# Patient Record
Sex: Male | Born: 1990 | Race: Asian | Hispanic: No | Marital: Married | Smoking: Never smoker
Health system: Southern US, Community
[De-identification: ages and names within clinical notes are randomized; demographics above are authoritative.]

---
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
## 2014-09-15 ENCOUNTER — Ambulatory Visit: Payer: 59

## 2014-09-21 ENCOUNTER — Ambulatory Visit (INDEPENDENT_AMBULATORY_CARE_PROVIDER_SITE_OTHER): Payer: 59 | Admitting: Family Medicine

## 2014-09-21 VITALS — BP 120/84 | HR 81 | Temp 97.4°F | Resp 16 | Ht 65.0 in | Wt 182.6 lb

## 2014-09-21 DIAGNOSIS — Z0000 Encounter for general adult medical examination without abnormal findings: Secondary | ICD-10-CM

## 2014-09-21 DIAGNOSIS — Z119 Encounter for screening for infectious and parasitic diseases, unspecified: Secondary | ICD-10-CM | POA: Diagnosis not present

## 2014-09-21 DIAGNOSIS — Z23 Encounter for immunization: Secondary | ICD-10-CM | POA: Diagnosis not present

## 2014-09-21 LAB — HEPATITIS B SURFACE ANTIBODY, QUANTITATIVE: Hepatitis B-Post: 1000 m[IU]/mL

## 2014-09-21 LAB — HEPATITIS C ANTIBODY: HCV Ab: NEGATIVE

## 2014-09-21 LAB — HIV ANTIBODY (ROUTINE TESTING W REFLEX): HIV 1&2 Ab, 4th Generation: NONREACTIVE

## 2014-09-21 NOTE — Progress Notes (Signed)
[facility] [address], [place] [id number] [phone]  Date:  09/21/2014   Name:  Austin Bailey   DOB:  08/03/1990   MRN:  [id number]  PCP:  No primary care provider on file.    Chief Complaint: Annual Exam   History of Present Illness:  Austin Bailey is a 24 y.o. 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  He is generally in good health.  Were able to get his [place] shot record- he is all UTD on required vaccines. Will need a hep B titer, hep C titer, HIV , TB screening and physical form completed  He has no major pmhx, no medications.    There are no active problems to display for this patient.   History reviewed. No pertinent past medical history.  History reviewed. No pertinent past surgical history.  History  Substance Use Topics  . Smoking status: Never Smoker   . Smokeless tobacco: Not on file  . Alcohol Use: No    Family History  Problem Relation Age of Onset  . Hypertension Father     No Known Allergies  Medication list has been reviewed and updated.  No current outpatient prescriptions on file prior to visit.   No current facility-administered medications on file prior to visit.    Review of Systems:  As per HPI- otherwise negative.   Physical Examination: Filed Vitals:   09/21/14 0833  BP: 120/84  Pulse: 81  Temp: 97.4 F (36.3 C)  Resp: 16   Filed Vitals:   09/21/14 0833  Height: 5\' 5"  (1.651 m)  Weight: 182 lb 9.6 oz (82.827 kg)   Body mass index is 30.39 kg/(m^2). Ideal Body Weight: Weight in (lb) to have BMI = 25: 149.9  GEN: WDWN, NAD, Non-toxic, A & O x 3, overweight, looks well HEENT: Atraumatic, Normocephalic. Neck supple. No masses, No LAD.  Bilateral TM wnl, oropharynx normal.  PEERL,EOMI.   Ears and Nose: No external deformity. CV: RRR, No M/G/R. No JVD. No thrill. No extra heart sounds. PULM:  CTA B, no wheezes, crackles, rhonchi. No retractions. No resp. distress. No accessory muscle use. ABD: S, NT, ND EXTR: No c/c/e NEURO Normal gait. Normal strength and DTR all extremities  PSYCH: Normally interactive. Conversant. Not depressed or anxious appearing.  Calm demeanor.    Assessment and Plan: Physical exam  Screening examination for infectious disease - Plan: Hepatitis B surface antibody, HIV antibody, Hepatitis C antibody, Quantiferon tb gold assay  Immunization due - Plan: Flu Vaccine QUAD 36+ mos IM  Completed forms for school, did labs as above, await results.  Immunizations are UTD except for his flu shot- done today Will be in touch with the rest of his labs  Signed Abbe AmsterdamJessica Rachele Lamaster, MD

## 2014-09-21 NOTE — Patient Instructions (Signed)
Best of luck to you in school. I would recommend that you set up your mychart account so you can print and view labs at home

## 2014-09-22 LAB — QUANTIFERON TB GOLD ASSAY (BLOOD)
Interferon Gamma Release Assay: NEGATIVE
Mitogen value: >10 IU/mL
QUANTIFERON NIL VALUE: 0.06 [IU]/mL
QUANTIFERON TB AG MINUS NIL: 0 [IU]/mL
TB AG VALUE: 0.05 [IU]/mL

## 2014-09-23 ENCOUNTER — Encounter: Payer: Self-pay | Admitting: Family Medicine

## 2016-12-09 ENCOUNTER — Ambulatory Visit (INDEPENDENT_AMBULATORY_CARE_PROVIDER_SITE_OTHER): Payer: Managed Care, Other (non HMO)

## 2016-12-09 ENCOUNTER — Ambulatory Visit (INDEPENDENT_AMBULATORY_CARE_PROVIDER_SITE_OTHER): Payer: Managed Care, Other (non HMO) | Admitting: Urgent Care

## 2016-12-09 ENCOUNTER — Encounter: Payer: Self-pay | Admitting: Urgent Care

## 2016-12-09 VITALS — BP 110/84 | HR 61 | Temp 98.6°F | Resp 18 | Ht 65.0 in | Wt 187.0 lb

## 2016-12-09 DIAGNOSIS — M545 Low back pain, unspecified: Secondary | ICD-10-CM

## 2016-12-09 DIAGNOSIS — R030 Elevated blood-pressure reading, without diagnosis of hypertension: Secondary | ICD-10-CM

## 2016-12-09 LAB — POCT URINALYSIS DIP (MANUAL ENTRY)
Bilirubin, UA: NEGATIVE
GLUCOSE UA: NEGATIVE mg/dL
Ketones, POC UA: NEGATIVE mg/dL
Leukocytes, UA: NEGATIVE
Nitrite, UA: NEGATIVE
Spec Grav, UA: >=1.03 — AB (ref 1.010–1.025)
Urobilinogen, UA: 0.2 E.U./dL
pH, UA: 5.5 (ref 5.0–8.0)

## 2016-12-09 MED ORDER — NAPROXEN SODIUM 550 MG PO TABS: 550.0000 mg | ORAL_TABLET | Freq: Two times a day (BID) | ORAL | 1 refills | Status: DC

## 2016-12-09 MED ORDER — CYCLOBENZAPRINE HCL 5 MG PO TABS: 5.0000 mg | ORAL_TABLET | Freq: Three times a day (TID) | ORAL | 1 refills | Status: DC | PRN

## 2016-12-09 NOTE — Patient Instructions (Addendum)
- Make sure you perform back care stretches 3 times per week. Do 3 sets for each stretch at 10 seconds per side. Make sure you hydrate very well with at least 2 liters of water (that's 1 gallon) daily. - If your blood pressure remains at or higher than 140/90 for either number, please come back to our clinic for a recheck.    Back Pain, Adult Back pain is very common in adults.The cause of back pain is rarely dangerous and the pain often gets better over time.The cause of your back pain may not be known. Some common causes of back pain include:  Strain of the muscles or ligaments supporting the spine.  Wear and tear (degeneration) of the spinal disks.  Arthritis.  Direct injury to the back. For many people, back pain may return. Since back pain is rarely dangerous, most people can learn to manage this condition on their own. Follow these instructions at home: Watch your back pain for any changes. The following actions may help to lessen any discomfort you are feeling:  Remain active. It is stressful on your back to sit or stand in one place for long periods of time. Do not sit, drive, or stand in one place for more than 30 minutes at a time. Take short walks on even surfaces as soon as you are able.Try to increase the length of time you walk each day.  Exercise regularly as directed by your health care provider. Exercise helps your back heal faster. It also helps avoid future injury by keeping your muscles strong and flexible.  Do not stay in bed.Resting more than 1-2 days can delay your recovery.  Pay attention to your body when you bend and lift. The most comfortable positions are those that put less stress on your recovering back. Always use proper lifting techniques, including:  Bending your knees.  Keeping the load close to your body.  Avoiding twisting.  Find a comfortable position to sleep. Use a firm mattress and lie on your side with your knees slightly bent. If you lie  on your back, put a pillow under your knees.  Avoid feeling anxious or stressed.Stress increases muscle tension and can worsen back pain.It is important to recognize when you are anxious or stressed and learn ways to manage it, such as with exercise.  Take medicines only as directed by your health care provider. Over-the-counter medicines to reduce pain and inflammation are often the most helpful.Your health care provider may prescribe muscle relaxant drugs.These medicines help dull your pain so you can more quickly return to your normal activities and healthy exercise.  Apply ice to the injured area:  Put ice in a plastic bag.  Place a towel between your skin and the bag.  Leave the ice on for 20 minutes, 2-3 times a day for the first 2-3 days. After that, ice and heat may be alternated to reduce pain and spasms.  Maintain a healthy weight. Excess weight puts extra stress on your back and makes it difficult to maintain good posture. Contact a health care provider if:  You have pain that is not relieved with rest or medicine.  You have increasing pain going down into the legs or buttocks.  You have pain that does not improve in one week.  You have night pain.  You lose weight.  You have a fever or chills. Get help right away if:  You develop new bowel or bladder control problems.  You have unusual weakness or  numbness in your arms or legs.  You develop nausea or vomiting.  You develop abdominal pain.  You feel faint. This information is not intended to replace advice given to you by your health care provider. Make sure you discuss any questions you have with your health care provider. Document Released: 06/29/2005 Document Revised: 11/07/2015 Document Reviewed: 10/31/2013 Elsevier Interactive Patient Education  2017 [employer]     IF you received an x-ray today, you will receive an invoice from [facility]. Please contact [facility] at  [phone] with questions or concerns regarding your invoice.   IF you received labwork today, you will receive an invoice from [facility]. Please contact [facility] at [phone] with questions or concerns regarding your invoice.   Our billing staff will not be able to assist you with questions regarding bills from these companies.  You will be contacted with the lab results as soon as they are available. The fastest way to get your results is to activate your My Chart account. Instructions are located on the last page of this paperwork. If you have not heard from us regarding the results in 2 weeks, please contact this office.

## 2016-12-09 NOTE — Progress Notes (Addendum)
MRN: [id number] DOB: 02/13/1991  Subjective:   Austin Bailey is a 26 y.o. male presenting for chief complaint of Back Pain (aching and sore pains x3weeks )  Reports 3 week history low back pain. Pain started when patient was sitting on the floor for a long period of time, rose very quickly to a standing position and has had pain since then. Pain is like a soreness/aching, worse with standing after sitting for a long period of time or from waking, radiates to left buttock occasionally. Has been using ibuprofen occasionally with minimal relief. Denies fever, n/v, abdominal pain, trauma, falls, numbness, tingling, weakness, incontinence. Patient would really like to make sure nothing is wrong with his back, requests an x-ray.  Austin Bailey is not currently taking any medications. Also has No Known Allergies. Austin Bailey denies past medical and surgical history.   Objective:   Vitals: BP (!) 138/96   Pulse 61   Temp 98.6 F (37 C) (Oral)   Resp 18   Ht 5\' 5"  (1.651 m)   Wt 187 lb (84.8 kg)   SpO2 98%   BMI 31.12 kg/m   BP Readings from Last 3 Encounters:  12/09/16 (!) 138/96  09/21/14 120/84    Physical Exam  Constitutional: He is oriented to person, place, and time. He appears well-developed and well-nourished.  Cardiovascular: Normal rate.   Pulmonary/Chest: Effort normal.  Musculoskeletal:       Lumbar back: He exhibits normal range of motion, no tenderness (Negative SLR), no bony tenderness, no swelling, no edema, no deformity, no laceration and no spasm.  Neurological: He is alert and oriented to person, place, and time.   Dg Lumbar Spine Complete  Result Date: 12/09/2016 CLINICAL DATA:  Acute left-sided low back pain without sciatica EXAM: LUMBAR SPINE - COMPLETE 4+ VIEW COMPARISON:  None. FINDINGS: There is no evidence of lumbar spine fracture. Alignment is normal. Intervertebral disc spaces are maintained. IMPRESSION: Negative. Electronically Signed   By: Marnee SpringJonathon  Watts M.D.   On:  12/09/2016 08:40   Results for orders placed or performed in visit on 12/09/16 (from the past 24 hour(s))  POCT urinalysis dipstick     Status: Abnormal   Collection Time: 12/09/16  8:24 AM  Result Value Ref Range   Color, UA yellow yellow   Clarity, UA clear clear   Glucose, UA negative negative mg/dL   Bilirubin, UA negative negative   Ketones, POC UA negative negative mg/dL   Spec Grav, UA >=7.829>=1.030 (A) 1.010 - 1.025   Blood, UA trace-intact (A) negative   pH, UA 5.5 5.0 - 8.0   Protein Ur, POC trace (A) negative mg/dL   Urobilinogen, UA 0.2 0.2 or 1.0 E.U./dL   Nitrite, UA Negative Negative   Leukocytes, UA Negative Negative   Assessment and Plan :   1. Acute left-sided low back pain without sciatica - Physical exam findings and x-ray are reassuring. Start conservative management, use NSAID and muscle relaxant. Perform back care stretching. RTC in 1-2 weeks if no improvement.  - POCT urinalysis dipstick - DG Lumbar Spine Complete; Future - naproxen sodium (ANAPROX DS) 550 MG tablet; Take 1 tablet (550 mg total) by mouth 2 (two) times daily with a meal.  Dispense: 30 tablet; Refill: 1 - cyclobenzaprine (FLEXERIL) 5 MG tablet; Take 1 tablet (5 mg total) by mouth 3 (three) times daily as needed.  Dispense: 90 tablet; Refill: 1  2. Elevated blood pressure reading - Monitor, rtc if BP remains elevated.  Wallis BambergMario Chaniya Genter, PA-C Primary  Care at [facility] [phone] 12/09/2016  8:12 AM

## 2016-12-30 ENCOUNTER — Ambulatory Visit (INDEPENDENT_AMBULATORY_CARE_PROVIDER_SITE_OTHER): Payer: Managed Care, Other (non HMO) | Admitting: Physician Assistant

## 2016-12-30 ENCOUNTER — Ambulatory Visit (HOSPITAL_COMMUNITY)
Admission: RE | Admit: 2016-12-30 | Discharge: 2016-12-30 | Disposition: A | Discharge status: Home / Self Care | Payer: Managed Care, Other (non HMO) | Source: Ambulatory Visit | Attending: Physician Assistant | Admitting: Physician Assistant

## 2016-12-30 ENCOUNTER — Encounter: Payer: Self-pay | Admitting: Physician Assistant

## 2016-12-30 VITALS — BP 131/83 | HR 72 | Temp 99.0°F | Resp 18 | Ht 65.35 in | Wt 187.0 lb

## 2016-12-30 DIAGNOSIS — R599 Enlarged lymph nodes, unspecified: Secondary | ICD-10-CM

## 2016-12-30 DIAGNOSIS — R590 Localized enlarged lymph nodes: Secondary | ICD-10-CM | POA: Insufficient documentation

## 2016-12-30 LAB — POCT RAPID STREP A (OFFICE): RAPID STREP A SCREEN: NEGATIVE

## 2016-12-30 NOTE — Progress Notes (Signed)
PRIMARY CARE AT [facility] [address] [place] [address] [phone]  Date:  12/30/2016   Name:  Austin Bailey   DOB:  Jul 16, 1990   MRN:  [id number]  PCP:  Patient, No Pcp Per    History of Present Illness:  Austin Bailey is a 26 y.o. male patient who presents to PCP with  Chief Complaint  Patient presents with  . Lymphadenopathy    swelling on right side x 2 days      Right side with swollen nodule he appreciated today.   He has had tenderness there for awhile.  Sore throat is mild at this time.  He has no fever.  No coughing, congestion, or ear pain.   He has had no specific dental issues.   There are no active problems to display for this patient.   History reviewed. No pertinent past medical history.  History reviewed. No pertinent surgical history.  Social History  Substance Use Topics  . Smoking status: Never Smoker  . Smokeless tobacco: Never Used  . Alcohol use No    Family History  Problem Relation Age of Onset  . Hypertension Father     No Known Allergies  Medication list has been reviewed and updated.  No current outpatient prescriptions on file prior to visit.   No current facility-administered medications on file prior to visit.     ROS ROS otherwise unremarkable unless listed above.  Physical Examination: BP 131/83   Pulse 72   Temp 99 F (37.2 C) (Oral)   Resp 18   Ht 5' 5.35" (1.66 m)   Wt 187 lb (84.8 kg)   SpO2 98%   BMI 30.78 kg/m  Ideal Body Weight: Weight in (lb) to have BMI = 25: 151.6  Physical Exam  Constitutional: He is oriented to person, place, and time. He appears well-developed and well-nourished. No distress.  HENT:  Head: Normocephalic and atraumatic.  Right Ear: Tympanic membrane, external ear and ear canal normal.  Left Ear: Tympanic membrane, external ear and ear canal normal.  Nose: No mucosal edema or rhinorrhea. Right sinus exhibits no maxillary sinus tenderness and no frontal sinus tenderness. Left sinus  exhibits no maxillary sinus tenderness and no frontal sinus tenderness.  Mouth/Throat: No uvula swelling. No oropharyngeal exudate, posterior oropharyngeal edema or posterior oropharyngeal erythema.  Eyes: Conjunctivae, EOM and lids are normal. Pupils are equal, round, and reactive to light. Right eye exhibits normal extraocular motion. Left eye exhibits normal extraocular motion.  Neck: Trachea normal and full passive range of motion without pain. No edema and no erythema present.  Cardiovascular: Normal rate and regular rhythm.   Pulmonary/Chest: Effort normal. No respiratory distress. He has no decreased breath sounds. He has no wheezes. He has no rhonchi.  Lymphadenopathy:       Head (right side): No preauricular and no posterior auricular adenopathy present.       Head (left side): No preauricular and no posterior auricular adenopathy present.    He has no cervical adenopathy.    He has no axillary adenopathy.  Right sided submandibular that is mildly tender rubbery nodule consistent with  lymphadenopathy   Neurological: He is alert and oriented to person, place, and time.  Skin: Skin is warm and dry. He is not diaphoretic.  Psychiatric: He has a normal mood and affect. His behavior is normal.     Assessment and Plan: Austin Bailey is a 26 y.o. male who is here today for cc of mild throat pain,  and swollen lymph node. --obtain us today. --will follow up pending result --possible strep, mono, etc. Swollen lymph nodes - Plan: POCT rapid strep A, Culture, Group A Strep, CBC with Differential/Platelet, Epstein-Barr virus VCA antibody panel, US Soft Tissue Head/Neck  Trena PlattStephanie Rashon Westrup, PA-C [facility] 6/24/20187:47 AM

## 2016-12-30 NOTE — Patient Instructions (Signed)
     IF you received an x-ray today, you will receive an invoice from [facility]. Please contact [facility] at [phone] with questions or concerns regarding your invoice.   IF you received labwork today, you will receive an invoice from [facility]. Please contact [facility] at [phone] with questions or concerns regarding your invoice.   Our billing staff will not be able to assist you with questions regarding bills from these companies.  You will be contacted with the lab results as soon as they are available. The fastest way to get your results is to activate your My Chart account. Instructions are located on the last page of this paperwork. If you have not heard from us regarding the results in 2 weeks, please contact this office.     

## 2016-12-31 LAB — CBC WITH DIFFERENTIAL/PLATELET
BASOS: 0 %
Basophils Absolute: 0 10*3/uL (ref 0.0–0.2)
EOS (ABSOLUTE): 0.2 10*3/uL (ref 0.0–0.4)
EOS: 3 %
HEMATOCRIT: 47.6 % (ref 37.5–51.0)
HEMOGLOBIN: 15.5 g/dL (ref 13.0–17.7)
Immature Grans (Abs): 0 10*3/uL (ref 0.0–0.1)
Immature Granulocytes: 0 %
LYMPHS ABS: 1.9 10*3/uL (ref 0.7–3.1)
Lymphs: 34 %
MCH: 26.5 pg — ABNORMAL LOW (ref 26.6–33.0)
MCHC: 32.6 g/dL (ref 31.5–35.7)
MCV: 82 fL (ref 79–97)
MONOCYTES: 7 %
Monocytes Absolute: 0.4 10*3/uL (ref 0.1–0.9)
Neutrophils Absolute: 3 10*3/uL (ref 1.4–7.0)
Neutrophils: 56 %
Platelets: 275 10*3/uL (ref 150–379)
RBC: 5.84 x10E6/uL — AB (ref 4.14–5.80)
RDW: 13.8 % (ref 12.3–15.4)
WBC: 5.5 10*3/uL (ref 3.4–10.8)

## 2016-12-31 LAB — EPSTEIN-BARR VIRUS VCA ANTIBODY PANEL
EBV NA IgG: >600 U/mL — ABNORMAL HIGH (ref 0.0–17.9)
EBV VCA IGG: 236 U/mL — AB (ref 0.0–17.9)
EBV VCA IgM: <36 U/mL (ref 0.0–35.9)

## 2017-01-01 LAB — CULTURE, GROUP A STREP: STREP A CULTURE: NEGATIVE

## 2017-01-03 ENCOUNTER — Other Ambulatory Visit: Payer: Self-pay | Admitting: Physician Assistant

## 2017-01-03 DIAGNOSIS — R590 Localized enlarged lymph nodes: Secondary | ICD-10-CM

## 2017-01-03 MED ORDER — AMOXICILLIN-POT CLAVULANATE 875-125 MG PO TABS: 1.0000 | ORAL_TABLET | Freq: Two times a day (BID) | ORAL | 0 refills | Status: AC

## 2017-10-11 ENCOUNTER — Encounter: Payer: Self-pay | Admitting: Physician Assistant

## 2018-03-02 IMAGING — US US SOFT TISSUE HEAD/NECK
1 series · 14 of 25 positions shown · non-contrast
Comparison: None.

CLINICAL DATA: 25-year-old male with painful right submandibular
nodule concerning for lymphadenopathy

EXAM:
ULTRASOUND OF HEAD/NECK SOFT TISSUES
TECHNIQUE: Ultrasound examination of the head and neck soft tissues was
performed in the area of clinical concern.

[Series 1: us soft tissue head/neck · 0.06mm/px · 14 of 43 slices shown]
[im 1/43]
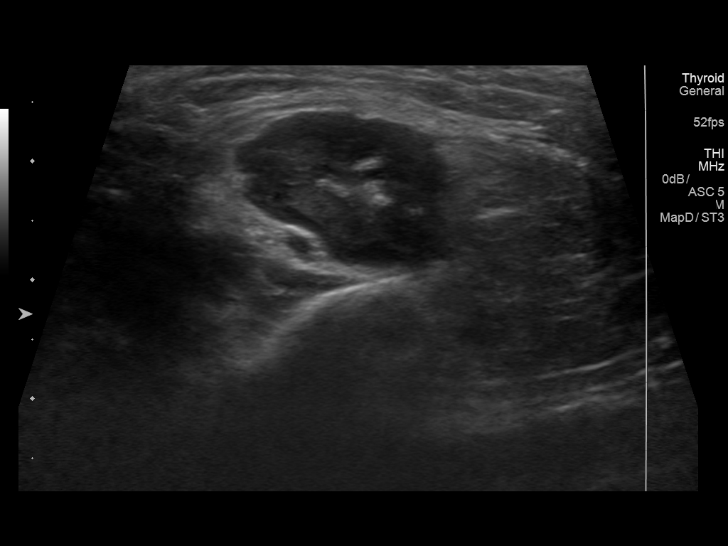
[im 4/43]
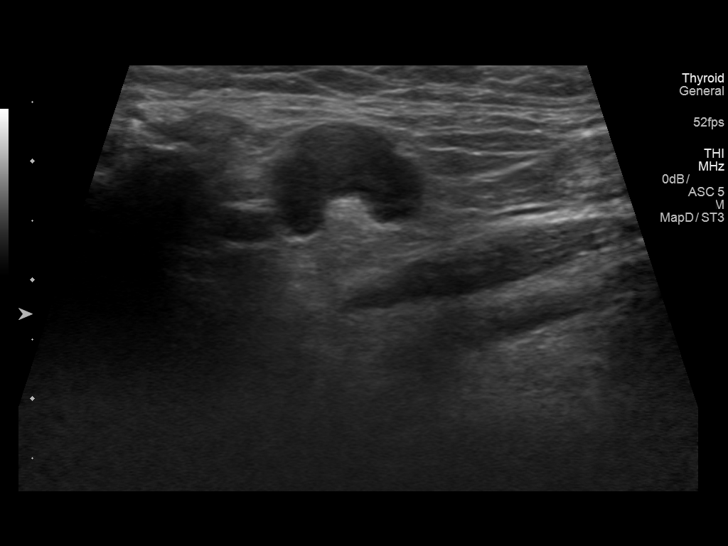
[im 8/43]
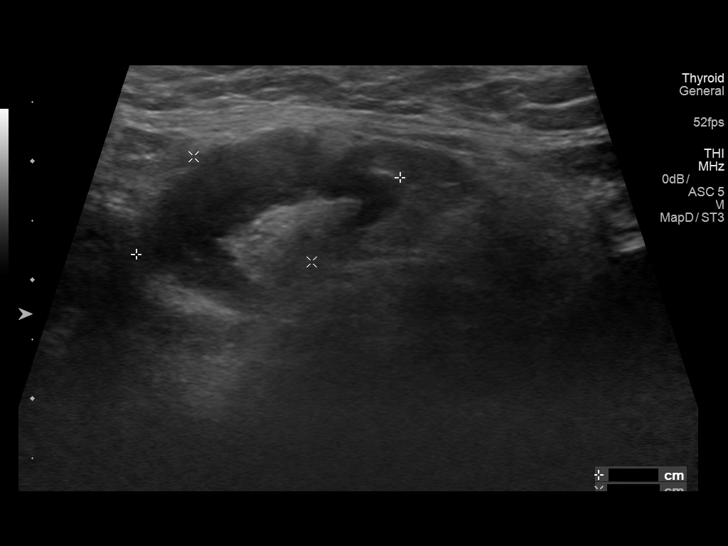
[im 11/43]
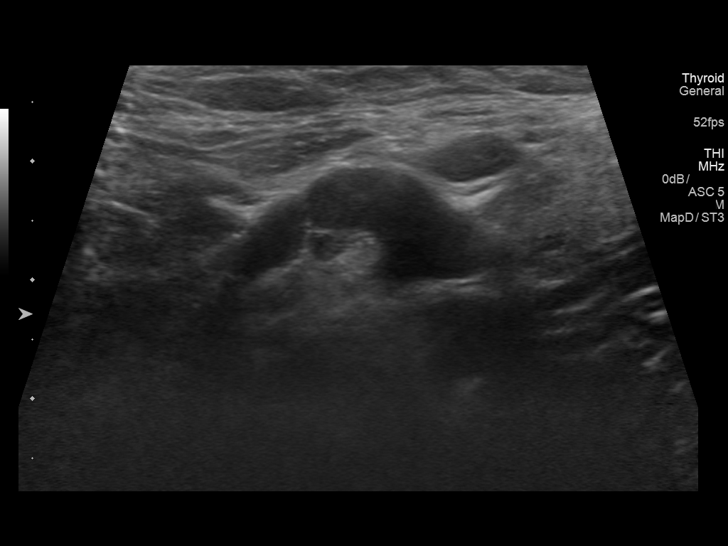
[im 15/43]
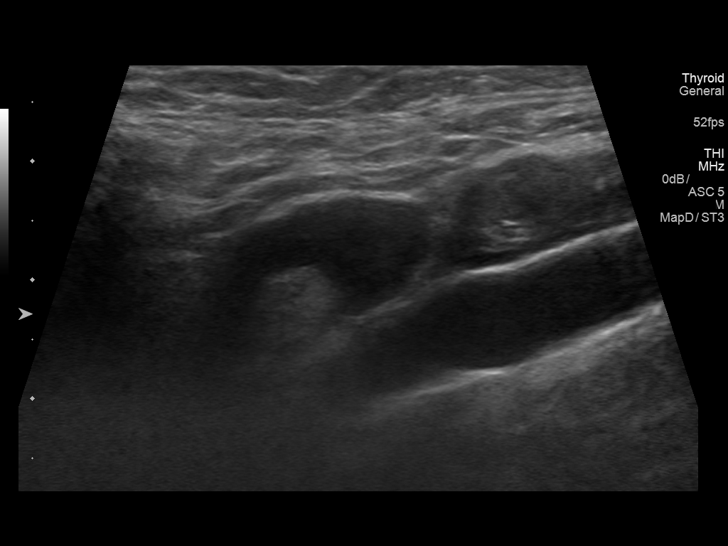
[im 16/43]
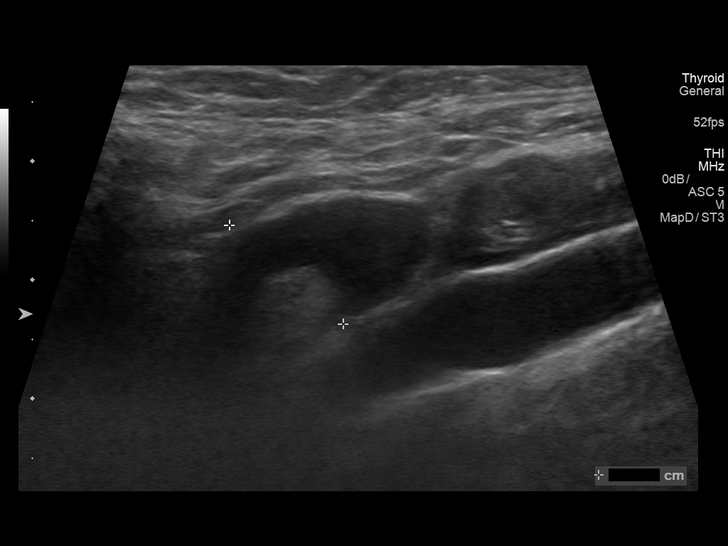
[im 20/43]
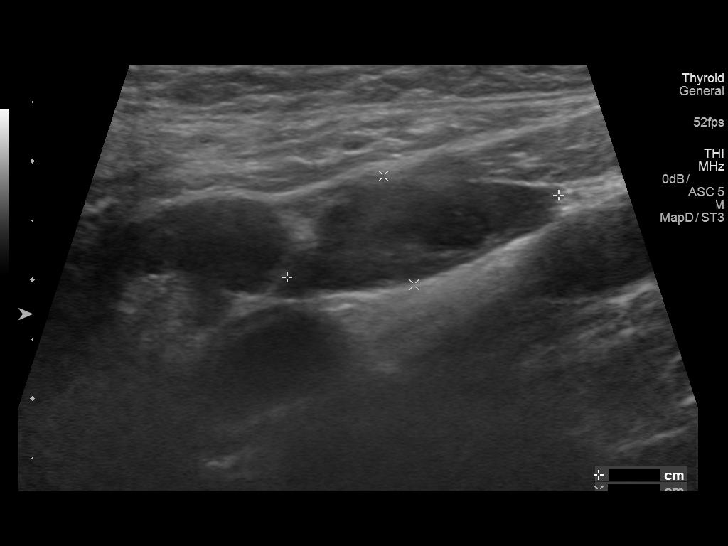
[im 23/43]
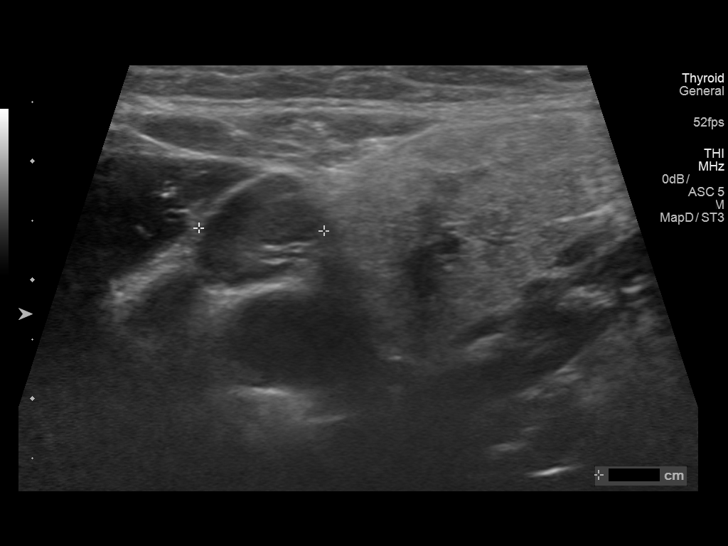
[im 27/43]
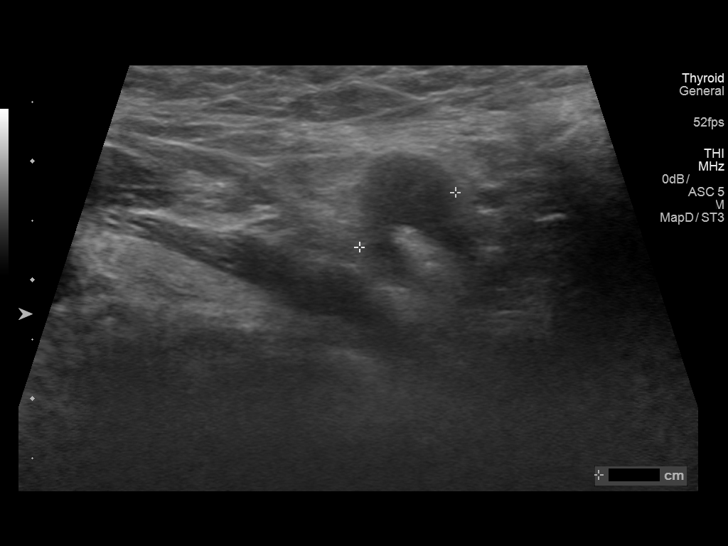
[im 29/43]
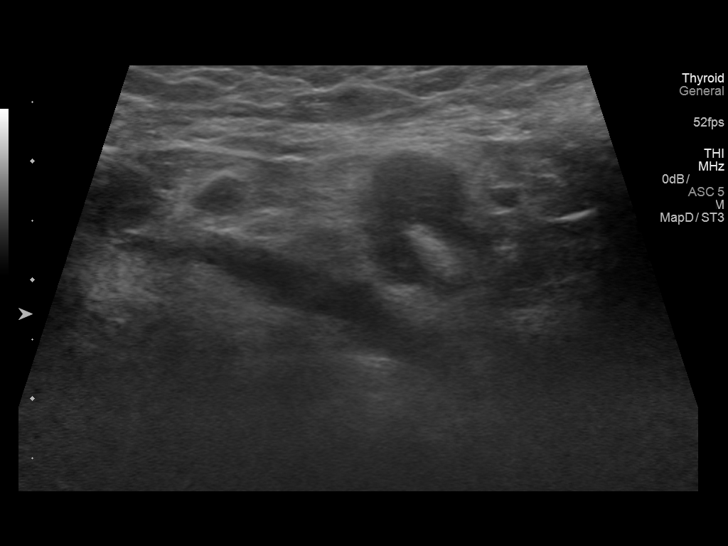
[im 32/43]
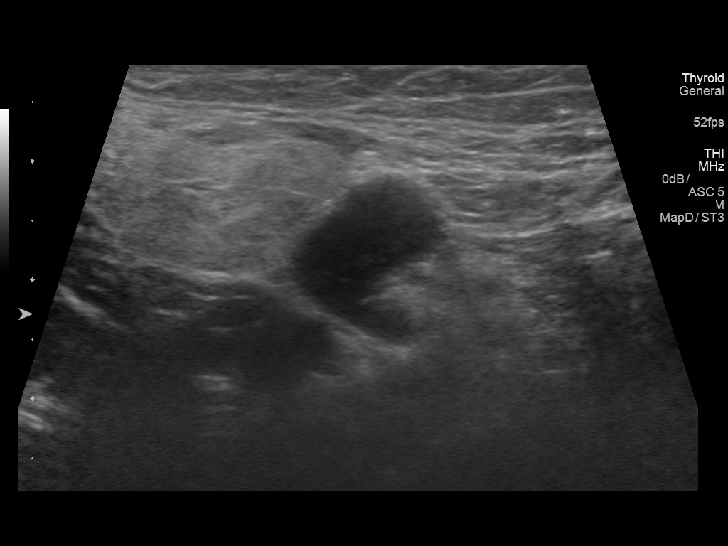
[im 36/43]
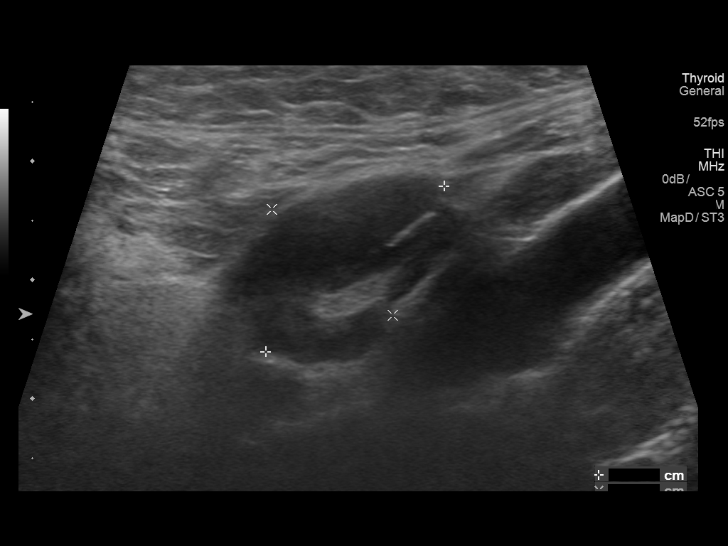
[im 39/43]
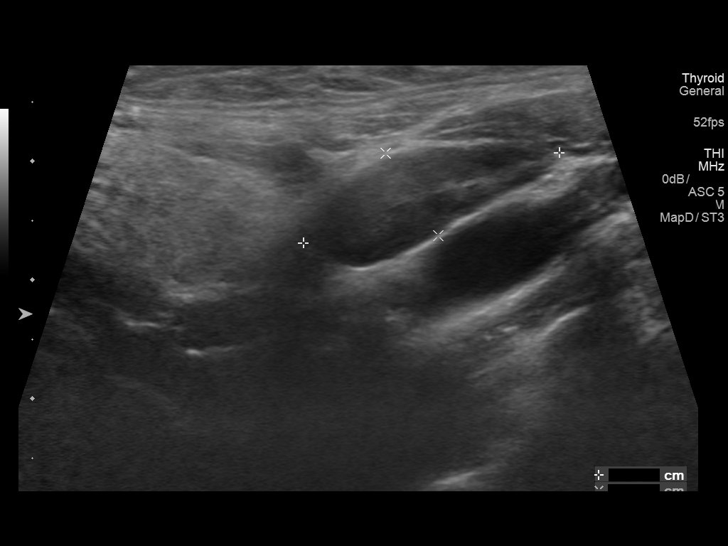
[im 43/43]
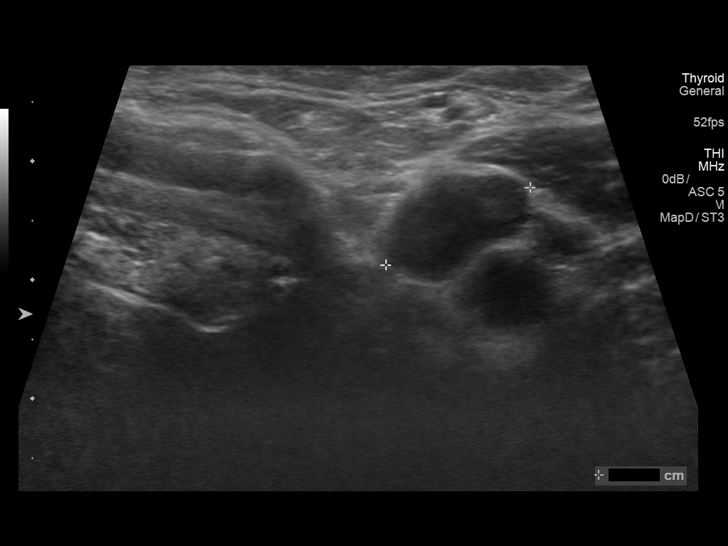

[14 of 25 positions shown; findings below may reference images not displayed]

FINDINGS: Sonographic interrogation of the region of clinical interest
demonstrates multiple hypoechoic soft tissue nodules with echogenic
fatty hila consistent with lymph nodes. Vascular pedicles are also
present. The nodes are mildly prominent and slightly enlarged
measuring up to 1.3 cm in short axis. Sonographic interrogation of
the contralateral side demonstrates similar appearing submandibular
lymph nodes.
IMPRESSION: The palpable abnormality corresponds with a prominent and slightly
enlarged submandibular lymph node. Of note, there are multiple lymph
nodes of similar sonographic appearance in both the right and left
submandibular stations.

These almost certainly represent reactive lymphadenopathy. Recommend
continued clinical surveillance. If there is concern for enlargement
over time, then repeat ultrasound could be performed to confirm.

## 2018-04-27 IMAGING — DX DG LUMBAR SPINE COMPLETE 4+V
5 series · 5 of 5 positions shown · non-contrast
Comparison: None.

CLINICAL DATA: Acute left-sided low back pain without sciatica

EXAM:
LUMBAR SPINE - COMPLETE 4+ VIEW

[l-spine ap]
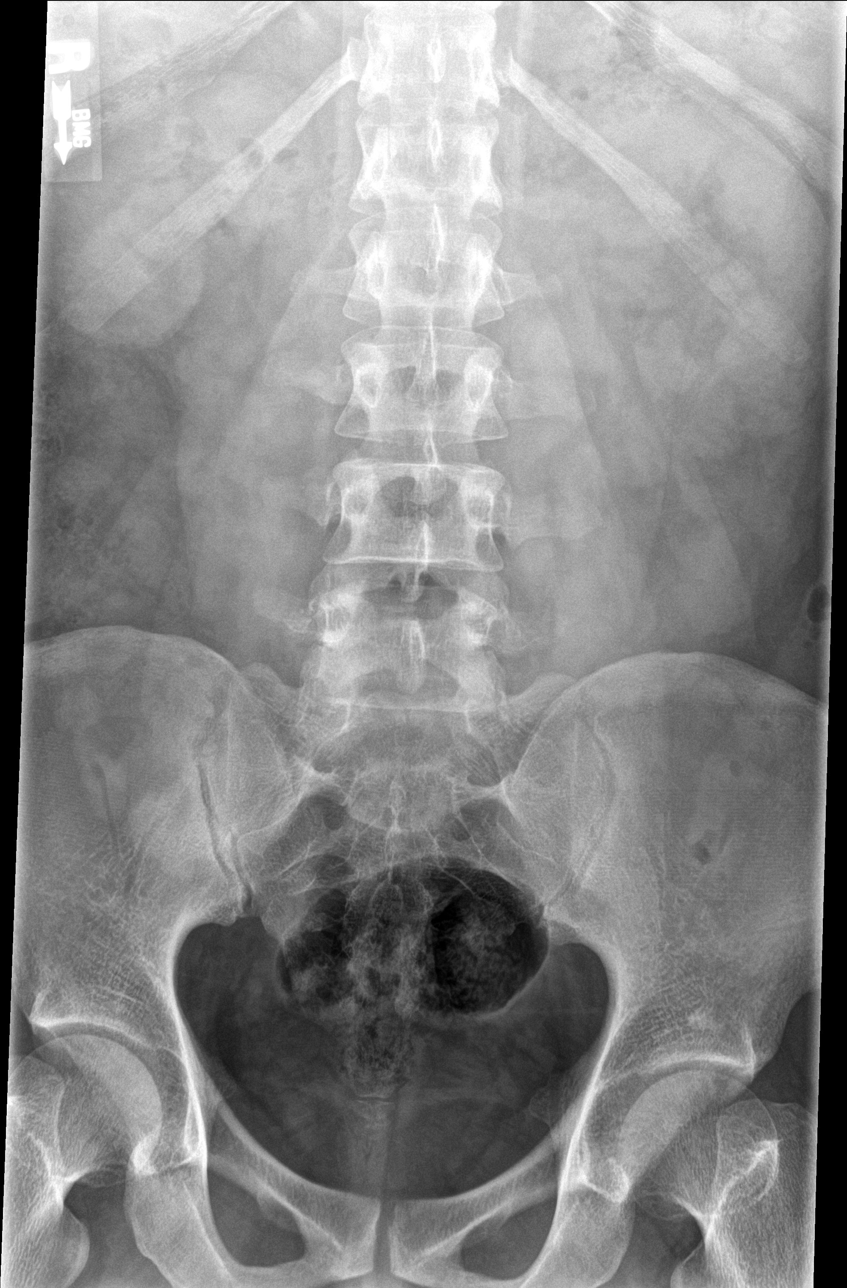

[l-spine obl (1 of 2)]
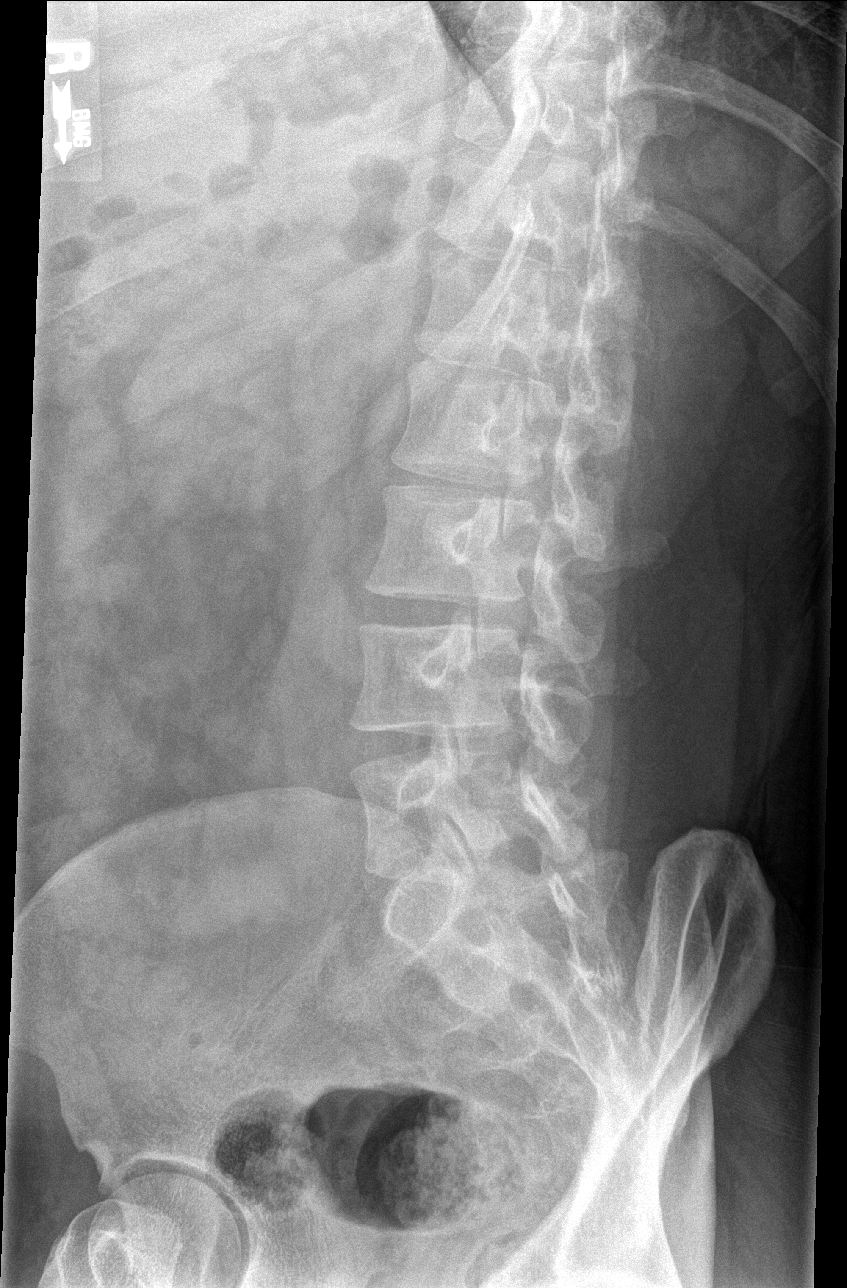

[l-spine obl (2 of 2)]
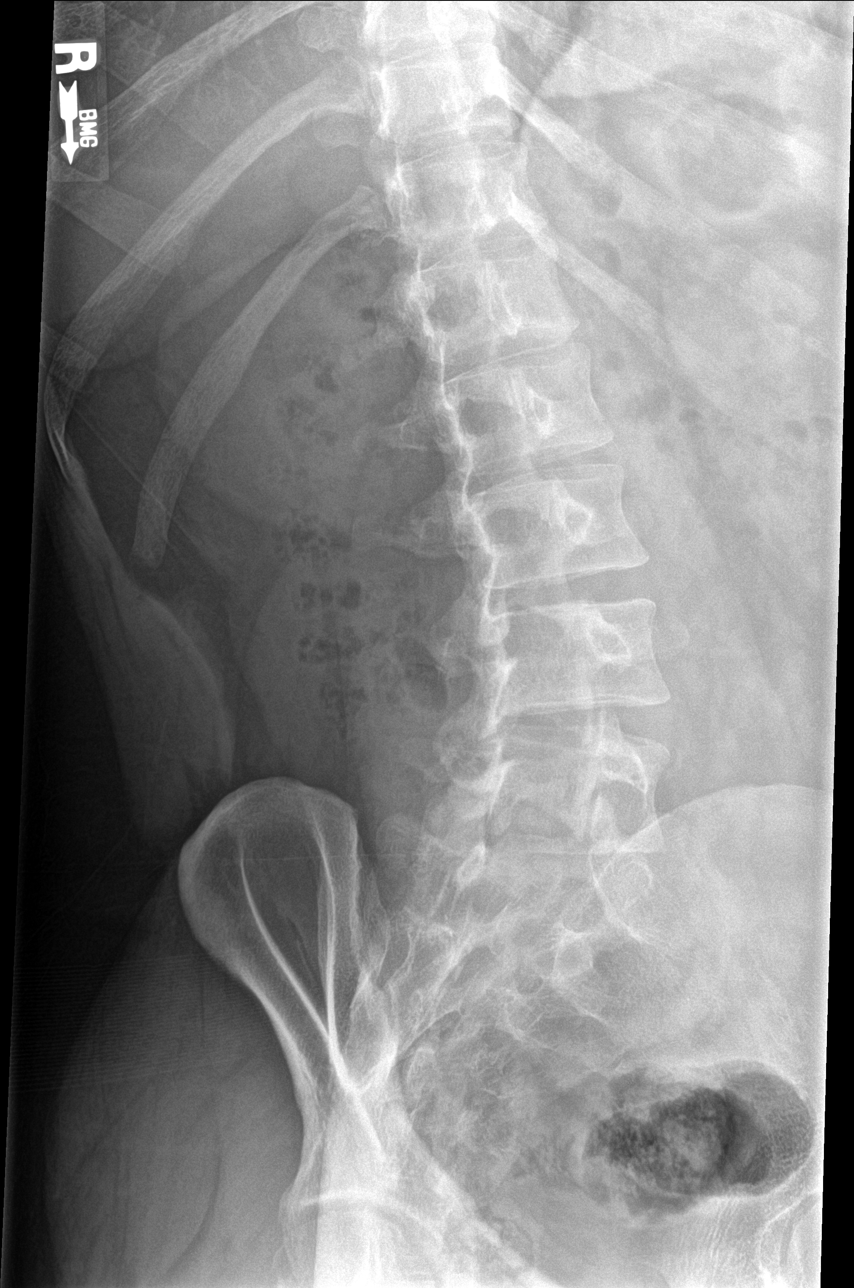

[l-spine lat]
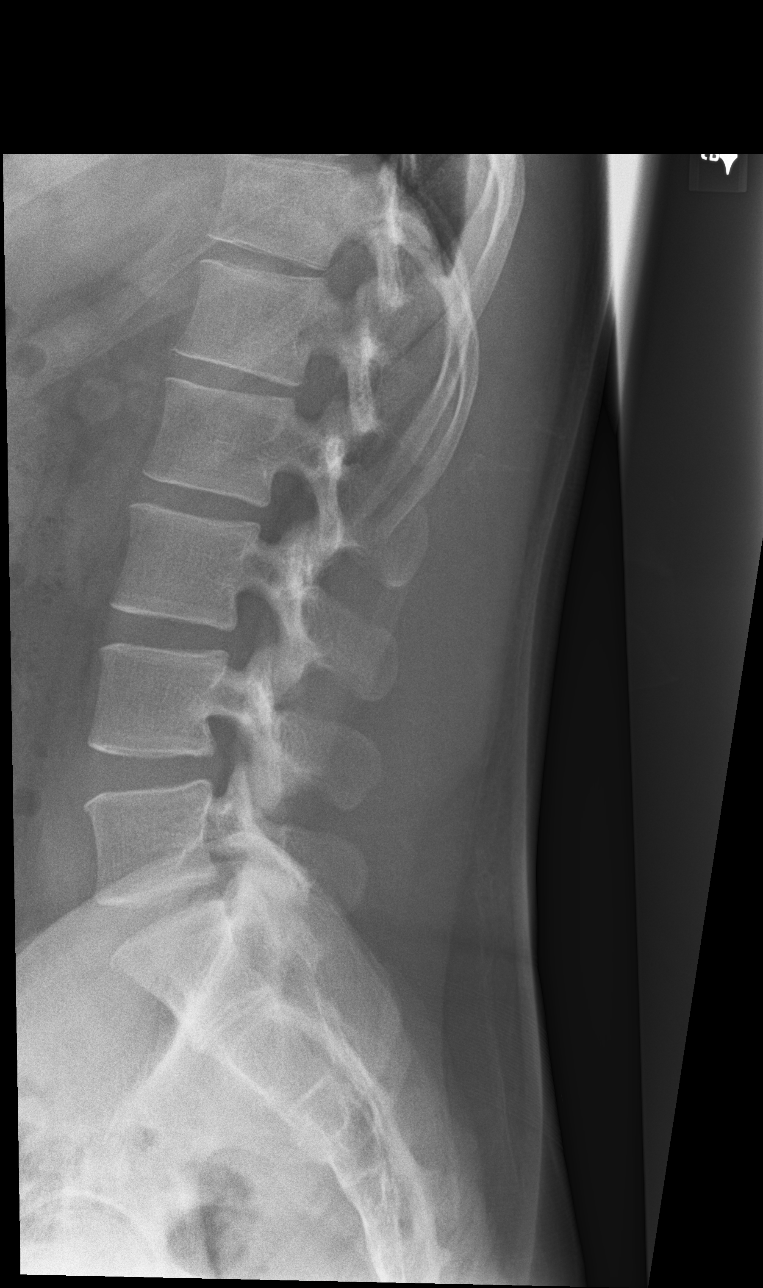

[l-spine l5-s1]
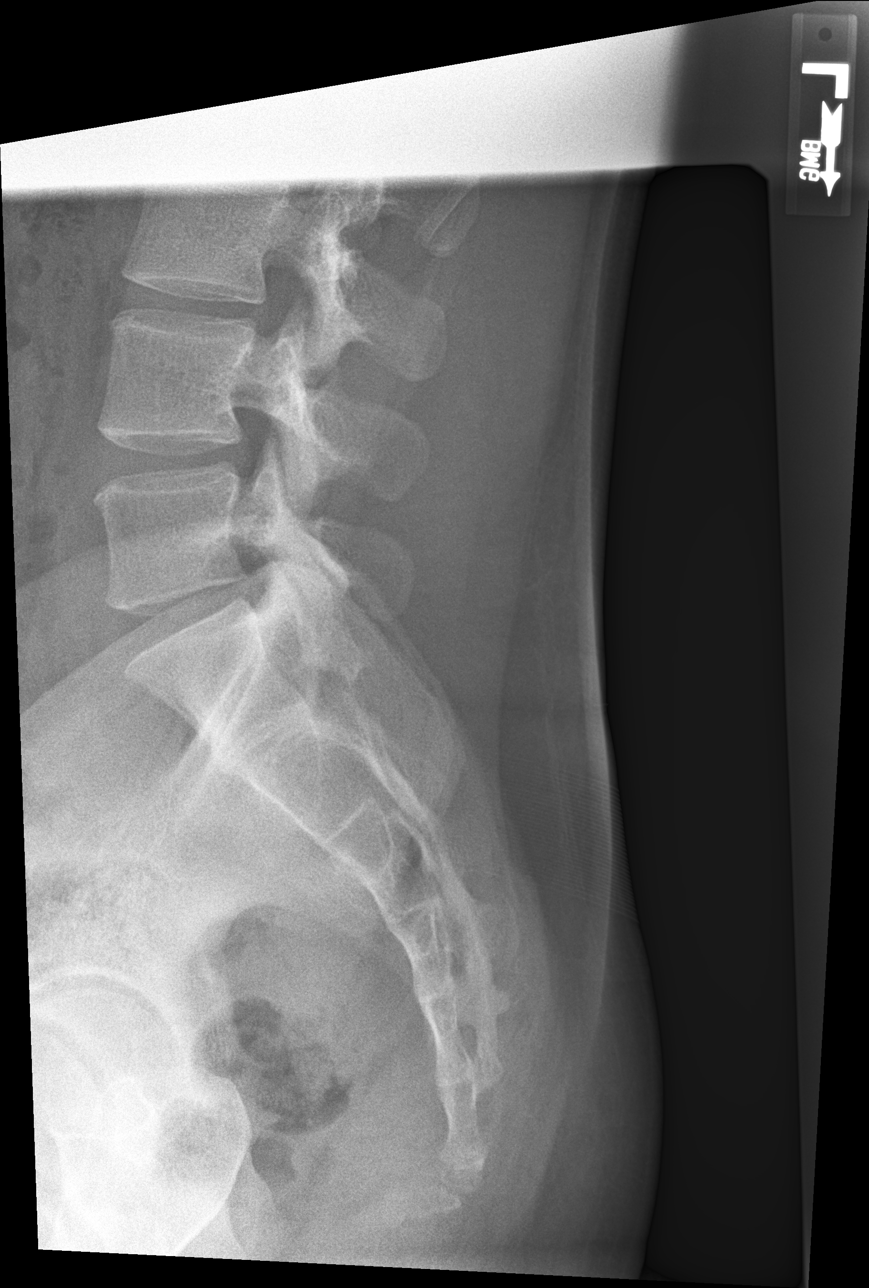

[5 of 5 positions shown; findings below may reference images not displayed]

FINDINGS: There is no evidence of lumbar spine fracture. Alignment is normal.
Intervertebral disc spaces are maintained.
IMPRESSION: Negative.

## 2020-02-05 DIAGNOSIS — H5213 Myopia, bilateral: Secondary | ICD-10-CM | POA: Diagnosis not present

## 2020-08-01 ENCOUNTER — Encounter (INDEPENDENT_AMBULATORY_CARE_PROVIDER_SITE_OTHER): Payer: Self-pay

## 2020-08-01 ENCOUNTER — Encounter: Payer: Self-pay | Admitting: Internal Medicine

## 2020-08-01 ENCOUNTER — Other Ambulatory Visit: Payer: Self-pay

## 2020-08-01 ENCOUNTER — Ambulatory Visit: Payer: 59 | Admitting: Internal Medicine

## 2020-08-01 VITALS — BP 118/80 | HR 98 | Temp 98.1°F | Resp 18 | Ht 63.24 in | Wt 188.4 lb

## 2020-08-01 DIAGNOSIS — F4320 Adjustment disorder, unspecified: Secondary | ICD-10-CM | POA: Insufficient documentation

## 2020-08-01 DIAGNOSIS — Z0000 Encounter for general adult medical examination without abnormal findings: Secondary | ICD-10-CM | POA: Diagnosis not present

## 2020-08-01 DIAGNOSIS — F4322 Adjustment disorder with anxiety: Secondary | ICD-10-CM

## 2020-08-01 LAB — CBC
HCT: 45.9 % (ref 39.0–52.0)
Hemoglobin: 15.3 g/dL (ref 13.0–17.0)
MCHC: 33.4 g/dL (ref 30.0–36.0)
MCV: 79 fl (ref 78.0–100.0)
Platelets: 318 10*3/uL (ref 150.0–400.0)
RBC: 5.81 Mil/uL (ref 4.22–5.81)
RDW: 13.7 % (ref 11.5–15.5)
WBC: 6.4 10*3/uL (ref 4.0–10.5)

## 2020-08-01 LAB — LIPID PANEL
Cholesterol: 183 mg/dL (ref 0–200)
HDL: 34.1 mg/dL — ABNORMAL LOW (ref >39.00)
NonHDL: 148.56
Total CHOL/HDL Ratio: 5
Triglycerides: 386 mg/dL — ABNORMAL HIGH (ref 0.0–149.0)
VLDL: 77.2 mg/dL — ABNORMAL HIGH (ref 0.0–40.0)

## 2020-08-01 LAB — COMPREHENSIVE METABOLIC PANEL
ALT: 39 U/L (ref 0–53)
AST: 18 U/L (ref 0–37)
Albumin: 4.5 g/dL (ref 3.5–5.2)
Alkaline Phosphatase: 67 U/L (ref 39–117)
BUN: 15 mg/dL (ref 6–23)
CO2: 30 mEq/L (ref 19–32)
Calcium: 9.2 mg/dL (ref 8.4–10.5)
Chloride: 105 mEq/L (ref 96–112)
Creatinine, Ser: 1.19 mg/dL (ref 0.40–1.50)
GFR: 82.14 mL/min (ref >60.00)
Glucose, Bld: 106 mg/dL — ABNORMAL HIGH (ref 70–99)
Potassium: 3.8 mEq/L (ref 3.5–5.1)
Sodium: 140 mEq/L (ref 135–145)
Total Bilirubin: 0.3 mg/dL (ref 0.2–1.2)
Total Protein: 7.5 g/dL (ref 6.0–8.3)

## 2020-08-01 LAB — HEMOGLOBIN A1C: Hgb A1c MFr Bld: 5.6 % (ref 4.6–6.5)

## 2020-08-01 MED ORDER — ESCITALOPRAM OXALATE 10 MG PO TABS: 10.0000 mg | ORAL_TABLET | Freq: Every day | ORAL | 3 refills | Status: AC

## 2020-08-01 NOTE — Assessment & Plan Note (Signed)
Flu shot up to date. Covid-19 up to date including booster. Tetanus up to date. Counseled about sun safety and mole surveillance. Counseled about the dangers of distracted driving. Given 10 year screening recommendations.

## 2020-08-01 NOTE — Patient Instructions (Signed)

## 2020-08-01 NOTE — Assessment & Plan Note (Signed)
Rx lexapro 10 mg daily and will assess in 1 month. Adjust as needed and continue with other coping skills also.

## 2020-08-01 NOTE — Progress Notes (Signed)
   Subjective:   Patient ID: Austin Bailey, male    DOB: March 28, 1991, 30 y.o.   MRN: [id number]  HPI The patient is a new 30 YO man coming in for wellness and some anxiety/ptsd. 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 Is interfering some in his life and would like treatment. Has sought meditation and other coping skills without relief.   PMH, [facility] social history reviewed and updated  Review of Systems  Constitutional: Negative.   HENT: Negative.   Eyes: Negative.   Respiratory: Negative for cough, chest tightness and shortness of breath.   Cardiovascular: Negative for chest pain, palpitations and leg swelling.  Gastrointestinal: Negative for abdominal distention, abdominal pain, constipation, diarrhea, nausea and vomiting.  Musculoskeletal: Negative.   Skin: Negative.   Neurological: Negative.   Psychiatric/Behavioral: The patient is nervous/anxious.     Objective:  Physical Exam Constitutional:      Appearance: He is well-developed and well-nourished.  HENT:     Head: Normocephalic and atraumatic.  Eyes:     Extraocular Movements: EOM normal.  Cardiovascular:     Rate and Rhythm: Normal rate and regular rhythm.  Pulmonary:     Effort: Pulmonary effort is normal. No respiratory distress.     Breath sounds: Normal breath sounds. No wheezing or rales.  Abdominal:     General: Bowel sounds are normal. There is no distension.     Palpations: Abdomen is soft.     Tenderness: There is no abdominal tenderness. There is no rebound.  Musculoskeletal:        General: No edema.     Cervical back: Normal range of motion.  Skin:    General: Skin is warm and dry.  Neurological:     Mental Status: He is alert and oriented to person, place, and time.     Coordination: Coordination normal.  Psychiatric:        Mood and Affect: Mood and affect normal.     Vitals:   08/01/20 1535  BP: 118/80  Pulse: 98  Resp: 18  Temp: 98.1 F (36.7 C)  TempSrc: Oral   SpO2: 99%  Weight: 188 lb 6.4 oz (85.5 kg)  Height: 5' 3.24" (1.606 m)    This visit occurred during the SARS-CoV-2 public health emergency.  Safety protocols were in place, including screening questions prior to the visit, additional usage of staff PPE, and extensive cleaning of exam room while observing appropriate contact time as indicated for disinfecting solutions.   Assessment & Plan:

## 2020-08-02 ENCOUNTER — Telehealth: Payer: Self-pay | Admitting: Internal Medicine

## 2020-08-02 LAB — LDL CHOLESTEROL, DIRECT: Direct LDL: 120 mg/dL

## 2020-08-02 NOTE — Telephone Encounter (Signed)
Patient returned call for recent lab results. He can be reached at [phone].

## 2020-08-02 NOTE — Telephone Encounter (Signed)
Spoke with the patient about his lab results. See result note.

## 2020-10-05 ENCOUNTER — Encounter: Payer: Self-pay | Admitting: Internal Medicine

## 2021-07-15 ENCOUNTER — Telehealth: Payer: Self-pay | Admitting: Internal Medicine

## 2021-07-15 DIAGNOSIS — U071 COVID-19: Secondary | ICD-10-CM

## 2021-07-15 DIAGNOSIS — J329 Chronic sinusitis, unspecified: Secondary | ICD-10-CM

## 2021-07-15 DIAGNOSIS — B9689 Other specified bacterial agents as the cause of diseases classified elsewhere: Secondary | ICD-10-CM

## 2021-07-15 MED ORDER — AMOXICILLIN-POT CLAVULANATE 875-125 MG PO TABS: 1.0000 | ORAL_TABLET | Freq: Two times a day (BID) | ORAL | 0 refills | Status: AC

## 2021-07-15 MED ORDER — HYDROCOD POLST-CPM POLST ER 10-8 MG/5ML PO SUER: 5.0000 mL | Freq: Two times a day (BID) | ORAL | 0 refills | Status: AC | PRN

## 2021-07-15 NOTE — Telephone Encounter (Signed)
Patient with worsening COVID-19 symptoms. Tested positive on 12/26. Had initial improvement in symptoms and returned to work. Then progressively worsening sinus congestion and constant productive cough that started three days ago. Feeling poorly overall. I have sent in a Rx for Augmentin to treat for secondary sinus infection, and tussionex for his cough. Patient knows to call or present to the ED for worsening symptoms.

## 2021-09-30 DIAGNOSIS — H5213 Myopia, bilateral: Secondary | ICD-10-CM | POA: Diagnosis not present
